# Patient Record
Sex: Female | Born: 1955 | Hispanic: No | Marital: Married | Smoking: Never smoker
Health system: Southern US, Community
[De-identification: ages and names within clinical notes are randomized; demographics above are authoritative.]

## PROBLEM LIST (undated history)

## (undated) DIAGNOSIS — I10 Essential (primary) hypertension: Secondary | ICD-10-CM

---

## 2002-09-05 ENCOUNTER — Other Ambulatory Visit: Admission: RE | Admit: 2002-09-05 | Discharge: 2002-09-05 | Payer: Self-pay | Admitting: Gynecology

## 2016-04-07 ENCOUNTER — Other Ambulatory Visit: Payer: Self-pay | Admitting: Obstetrics and Gynecology

## 2016-04-07 DIAGNOSIS — R928 Other abnormal and inconclusive findings on diagnostic imaging of breast: Secondary | ICD-10-CM

## 2016-04-13 ENCOUNTER — Ambulatory Visit
Admission: RE | Admit: 2016-04-13 | Discharge: 2016-04-13 | Disposition: A | Discharge status: Home / Self Care | Payer: BC Managed Care – PPO | Source: Ambulatory Visit | Attending: Obstetrics and Gynecology | Admitting: Obstetrics and Gynecology

## 2016-04-13 DIAGNOSIS — R928 Other abnormal and inconclusive findings on diagnostic imaging of breast: Secondary | ICD-10-CM

## 2017-04-04 ENCOUNTER — Emergency Department (HOSPITAL_BASED_OUTPATIENT_CLINIC_OR_DEPARTMENT_OTHER)
Admission: EM | Admit: 2017-04-04 | Discharge: 2017-04-04 | Disposition: A | Discharge status: Home / Self Care | Payer: Worker's Compensation | Attending: Emergency Medicine | Admitting: Emergency Medicine

## 2017-04-04 ENCOUNTER — Encounter (HOSPITAL_BASED_OUTPATIENT_CLINIC_OR_DEPARTMENT_OTHER): Payer: Self-pay | Admitting: Emergency Medicine

## 2017-04-04 ENCOUNTER — Emergency Department (HOSPITAL_BASED_OUTPATIENT_CLINIC_OR_DEPARTMENT_OTHER): Payer: Worker's Compensation

## 2017-04-04 DIAGNOSIS — Y9289 Other specified places as the place of occurrence of the external cause: Secondary | ICD-10-CM | POA: Insufficient documentation

## 2017-04-04 DIAGNOSIS — Y939 Activity, unspecified: Secondary | ICD-10-CM | POA: Diagnosis not present

## 2017-04-04 DIAGNOSIS — Y990 Civilian activity done for income or pay: Secondary | ICD-10-CM | POA: Diagnosis not present

## 2017-04-04 DIAGNOSIS — M25522 Pain in left elbow: Secondary | ICD-10-CM | POA: Diagnosis not present

## 2017-04-04 DIAGNOSIS — Z79899 Other long term (current) drug therapy: Secondary | ICD-10-CM | POA: Diagnosis not present

## 2017-04-04 DIAGNOSIS — I10 Essential (primary) hypertension: Secondary | ICD-10-CM | POA: Diagnosis not present

## 2017-04-04 DIAGNOSIS — W010XXA Fall on same level from slipping, tripping and stumbling without subsequent striking against object, initial encounter: Secondary | ICD-10-CM | POA: Diagnosis not present

## 2017-04-04 HISTORY — DX: Essential (primary) hypertension: I10

## 2017-04-04 NOTE — ED Triage Notes (Signed)
Pt c/o LUE pain x 3 wks s/p falling over crate onto floor on LT side

## 2017-04-04 NOTE — ED Provider Notes (Signed)
[facility]   CSN: [id number] Arrival date & time: 04/04/17  4098     History   Chief Complaint Chief Complaint  Patient presents with  . Arm Pain    HPI Julie Sexton is a 61 y.o. female.  HPI 61 year old female with past medical history significant for hypertension presents to the emergency Department today with complaints of left elbow pain. Patient states that approximately 3 weeks ago she was at work and fell over a crate landing onto her left elbow. She denies any head trauma or LOC. States that initially she had significant amount of pain and was doing over-the-counter remedies. States that her range of motion has slightly improved but she continues to have pain in her left elbow. This is worse when she is weight lifting. Worse with flexion of the elbow. Denies any erythema, edema, ecchymosis, paresthesias, weakness. Past Medical History:  Diagnosis Date  . Hypertension     There are no active problems to display for this patient.   History reviewed. No pertinent surgical history.  OB History    No data available       Home Medications    Prior to Admission medications   Medication Sig Start Date End Date Taking? Authorizing Provider  amLODipine (NORVASC) 5 MG tablet Take 5 mg by mouth daily.   Yes [provider]  cholecalciferol (VITAMIN D) 1000 units tablet Take 2,000 Units by mouth daily.   Yes [provider]    Family History No family history on file.  Social History Social History  Substance Use Topics  . Smoking status: Never Smoker  . Smokeless tobacco: Never Used  . Alcohol use Yes     Comment: occ     Allergies   Patient has no known allergies.   Review of Systems Review of Systems  Constitutional: Negative for chills and fever.  Eyes: Negative for visual disturbance.  Musculoskeletal: Positive for arthralgias and joint swelling.  Skin: Negative for wound.  Neurological: Negative for  syncope, weakness, numbness and headaches.     Physical Exam Updated Vital Signs BP (!) 166/72   Pulse 74   Temp 98.7 F (37.1 C) (Oral)   Resp 16   Ht 5\' 2"  (1.575 m)   Wt 69.4 kg (153 lb)   SpO2 100%   BMI 27.98 kg/m   Physical Exam  Constitutional: She appears well-developed and well-nourished. No distress.  HENT:  Head: Normocephalic and atraumatic.  Eyes: Right eye exhibits no discharge. Left eye exhibits no discharge. No scleral icterus.  Neck: Normal range of motion.  Pulmonary/Chest: No respiratory distress.  Musculoskeletal:       Left elbow: She exhibits decreased range of motion (worse with flexion). She exhibits no swelling, no effusion, no deformity and no laceration. Tenderness found. Radial head, medial epicondyle and lateral epicondyle tenderness noted. No olecranon process tenderness noted.  Radial pulses are 2+ bilaterally. Sensation intact. Cap refill normal.  Neurological: She is alert.  Strength 5 out of 5 with grip strength bilaterally in upper extremities.   Skin: No pallor.  Psychiatric: Her behavior is normal. Judgment and thought content normal.  Nursing note and vitals reviewed.    ED Treatments / Results  Labs (all labs ordered are listed, but only abnormal results are displayed) Labs Reviewed - No data to display  EKG  EKG Interpretation None       Radiology Dg Elbow Complete Left  Result Date: 04/04/2017 CLINICAL DATA:  Medial elbow pain  secondary to a fall 3 weeks ago. EXAM: LEFT ELBOW - COMPLETE 3+ VIEW COMPARISON:  None. FINDINGS: There is calcification in the soft tissues adjacent and the medial and lateral epicondyles of the distal humerus consistent with calcific tendinopathy. There is suggestion of an elbow joint effusion on the lateral view but there is no discrete fracture or dislocation. IMPRESSION: 1. Nonspecific joint effusion. 2. 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 Electronically  Signed   By: Francene BoyersJames  Maxwell M.D.   On: 04/04/2017 11:01    Procedures Procedures (including critical care time)  Medications Ordered in ED Medications - No data to display   Initial Impression / Assessment and Plan / ED Course  I have reviewed the triage vital signs and the nursing notes.  Pertinent labs & imaging results that were available during my care of the patient were reviewed by me and considered in my medical decision making (see chart for details).     Patient presents to the ED with complaints of left elbow pain after mechanical fall 3 weeks ago. She is neurovascularly intact. Strength is normal. Range of motion is normal. X-ray shows nonspecific joint effusion and chronic tendinopathy consistent with epicondylitis. No acute fractures noted. We'll place in sling and encouraged symptomatically treatment at home with orthopedic follow-up.  Pt is hemodynamically stable, in NAD, & able to ambulate in the ED. Evaluation does not show pathology that would require ongoing emergent intervention or inpatient treatment. I explained the diagnosis to the patient. Pain has been managed & has no complaints prior to dc. Pt is comfortable with above plan and is stable for discharge at this time. All questions were answered prior to disposition. Strict return precautions for f/u to the ED were discussed. Encouraged follow up with PCP.   Final Clinical Impressions(s) / ED Diagnoses   Final diagnoses:  Left elbow pain    New Prescriptions Discharge Medication List as of 04/04/2017 11:38 AM       Rise MuLeaphart, Shanta Dorvil T, PA-C 04/04/17 1243    Charlynne PanderYao, David Hsienta, MD 04/04/17 [phone]

## 2017-04-04 NOTE — Discharge Instructions (Signed)
Your x-ray shows no fracture. Small elbow fluid. Wear the sling until follow-up with orthopedist. Please rest, ice, compress and elevated the affected body part to help with swelling and pain. Motrin Tylenol for pain and swelling. Follow-up with orthopedist. Return to the ED if symptoms worsen.

## 2017-04-04 NOTE — ED Notes (Signed)
Patient transported to X-ray 

## 2017-04-13 ENCOUNTER — Ambulatory Visit: Payer: Self-pay | Admitting: Family Medicine

## 2017-08-24 IMAGING — MG 2D DIGITAL DIAGNOSTIC UNILATERAL RIGHT MAMMOGRAM WITH CAD AND AD
6 series · 6 of 14 positions shown · non-contrast
Comparison: Previous exam(s).

CLINICAL DATA: Screening recall for possible right breast
asymmetry.

EXAM:
2D DIGITAL DIAGNOSTIC UNILATERAL RIGHT MAMMOGRAM WITH CAD AND
ADJUNCT TOMO

[R MLO synth-2D]
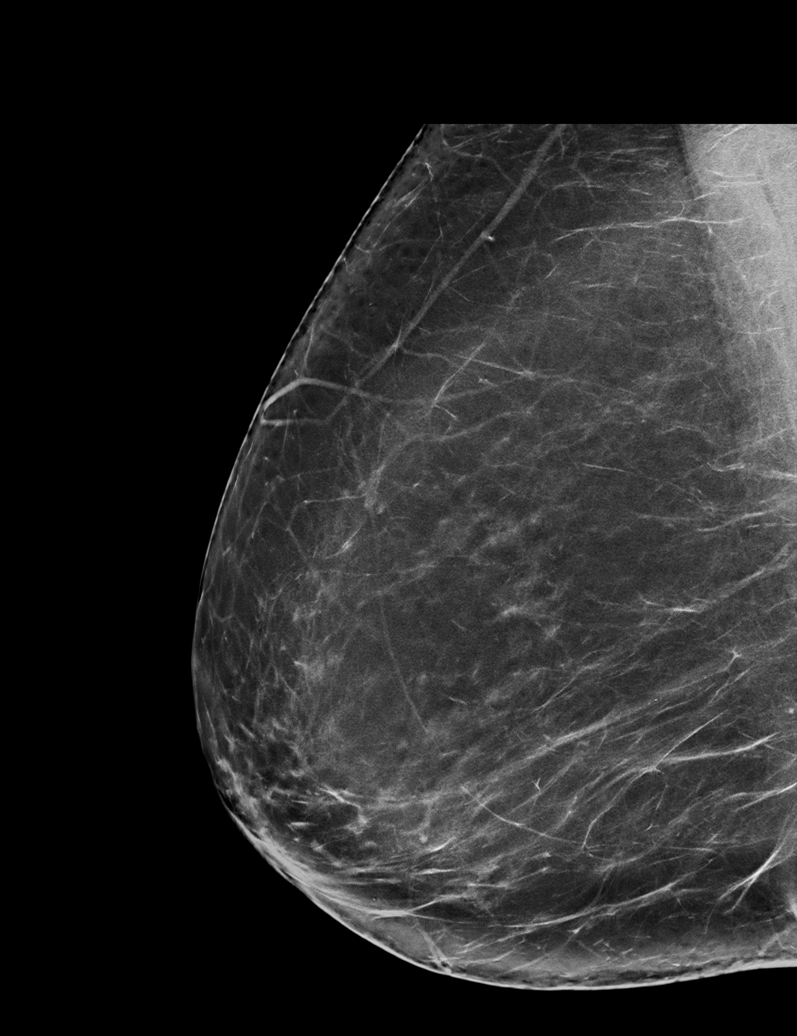

[R MLO]
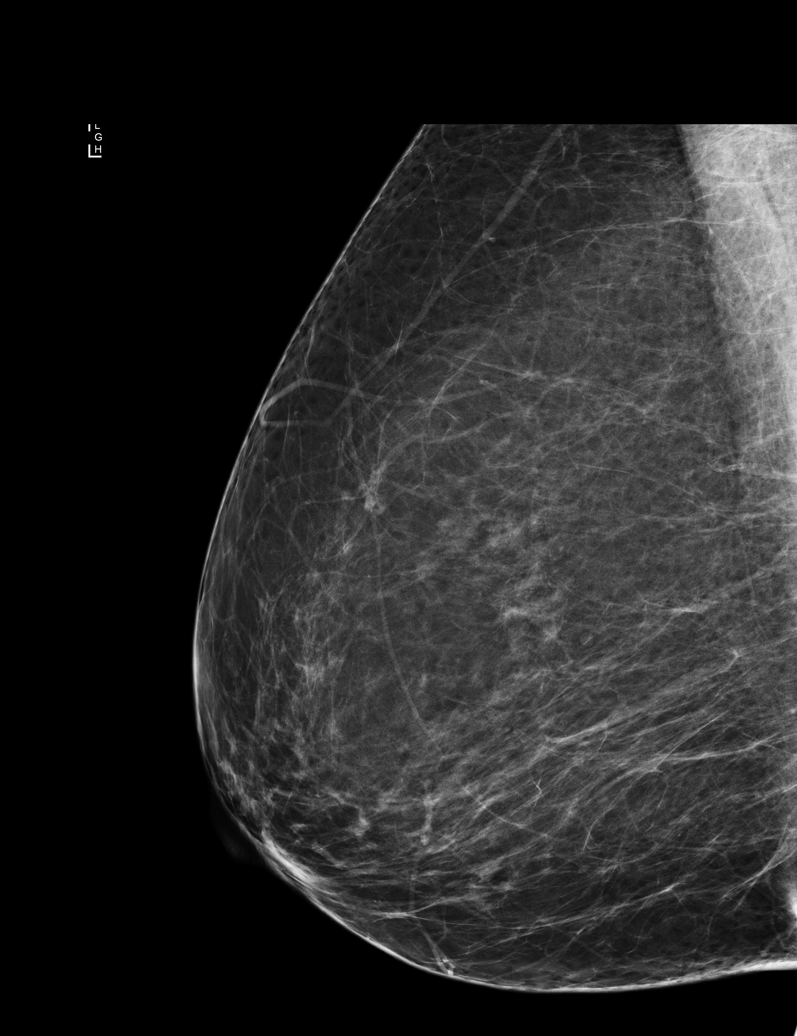

[R CC synth-2D]
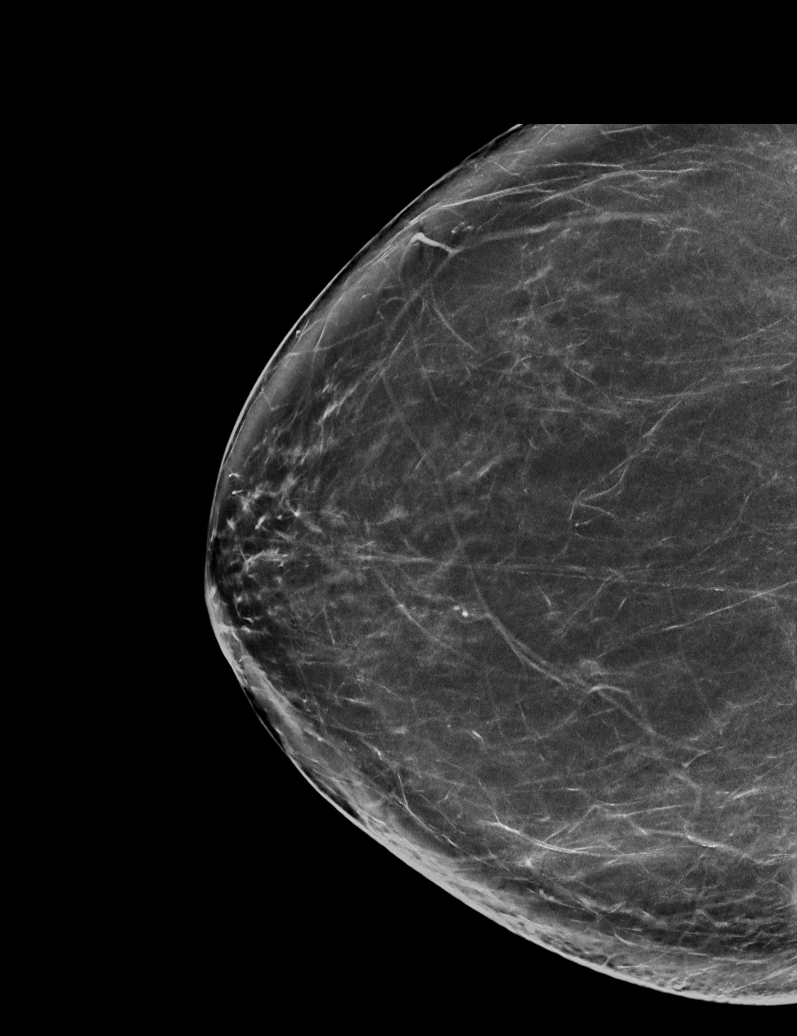

[R CC]
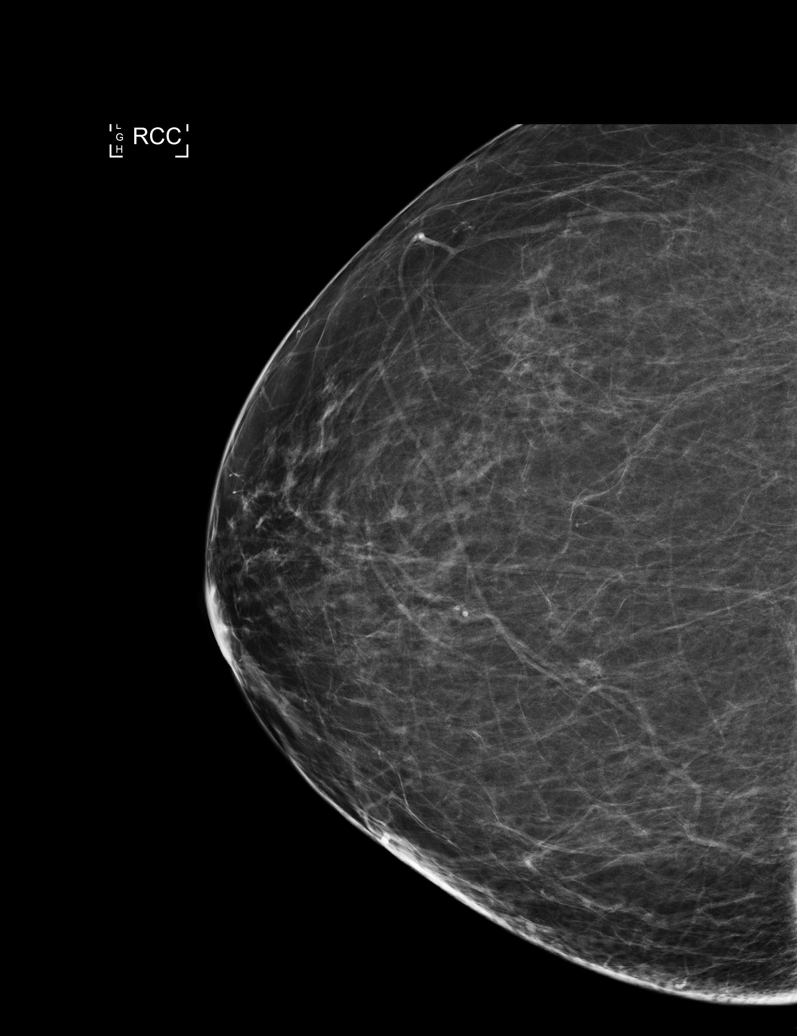

[R CC tomo · tomo slice 40/79.0]
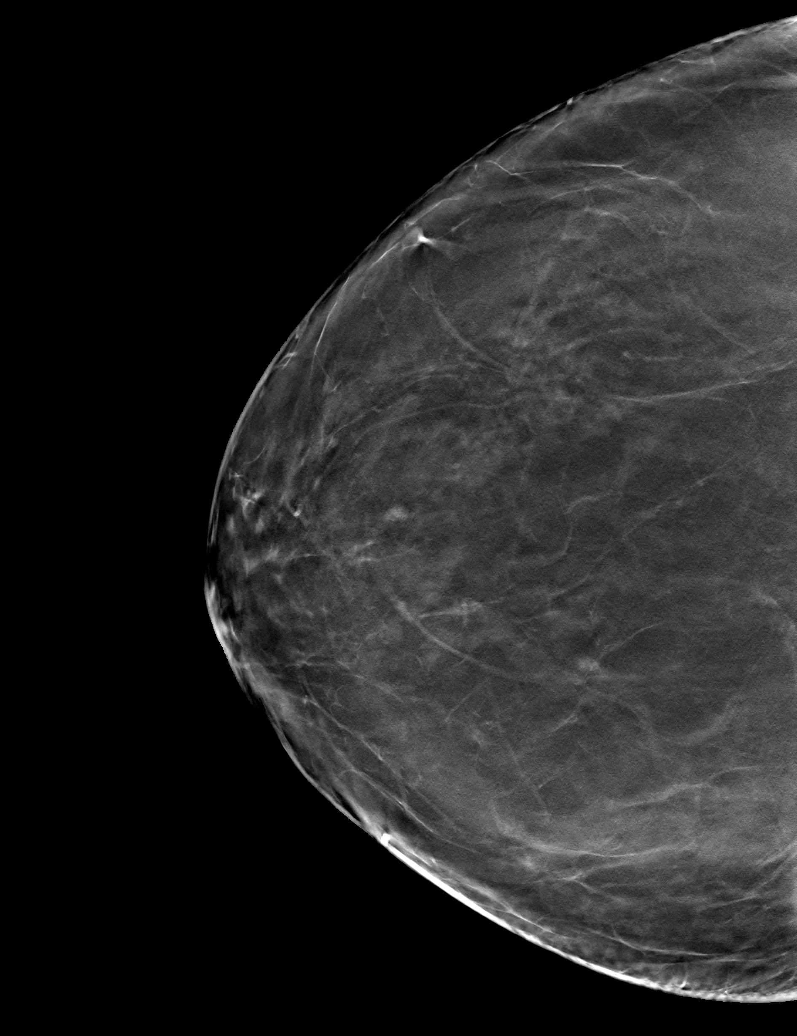

[R MLO tomo · tomo slice 42/83.0]
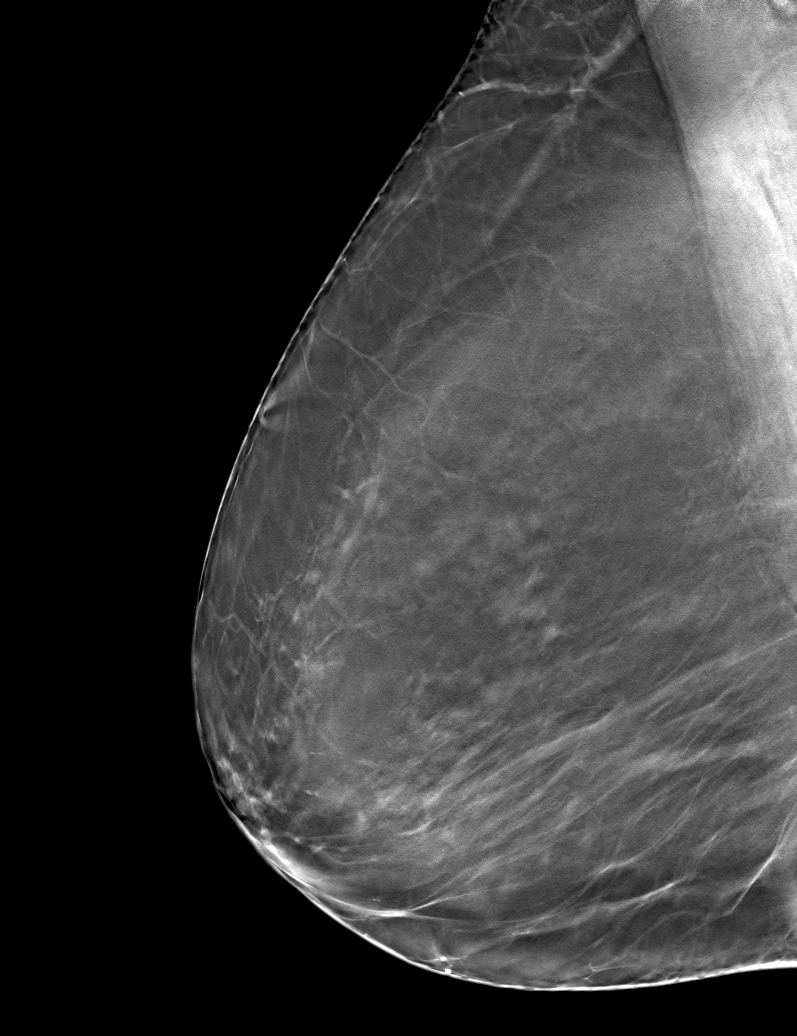

[6 of 14 positions shown; findings below may reference images not displayed]

ACR Breast Density Category b: There are scattered areas of
fibroglandular density.
FINDINGS: Cc and MLO tomograms were performed of the right breast. There is no
persistent mass or distortion seen in the right breast. There is no
mammographic evidence of malignancy in the right breast.

Mammographic images were processed with CAD.
IMPRESSION: No mammographic evidence of malignancy in the right breast.

RECOMMENDATION:
Screening mammogram in one year.(Code:[id number])

I have discussed the findings and recommendations with the patient.
Results were also provided in writing at the conclusion of the
visit. If applicable, a reminder letter will be sent to the patient
regarding the next appointment.

BI-RADS CATEGORY  1: Negative.

## 2018-05-25 ENCOUNTER — Ambulatory Visit: Payer: Self-pay | Admitting: Family Medicine

## 2018-06-02 NOTE — Progress Notes (Signed)
[name] [address] [place], [place] [address] Phone: [phone] Subjective:   Julie Sexton, am serving as a scribe for Dr. Antoine Primas.  I'm seeing this patient by the request  of:  [name], MD   CC: right shoulder pain   LFY:[id number]  Julie Sexton is a 62 y.o. female coming in with complaint of right shoulder pain. Pain for 1 month in posterior deltoid. Insidious onset. Patient is limited in IR and is unable to take bra off. Painful when lying on shoulder. Denies any radicular symptoms. Patient notices a click in the shoulder. Has not done anything to treat pain.  States that it is annoying and can be uncomfortable at night but not stopping her from activities.      Past Medical History:  Diagnosis Date  . Hypertension    No past surgical history on file. Social History   Socioeconomic History  . Marital status: Married    Spouse name: Not on file  . Number of children: Not on file  . Years of education: Not on file  . Highest education level: Not on file  Occupational History  . Not on file  Social Needs  . Financial resource strain: Not on file  . Food insecurity:    Worry: Not on file    Inability: Not on file  . Transportation needs:    Medical: Not on file    Non-medical: Not on file  Tobacco Use  . Smoking status: Never Smoker  . Smokeless tobacco: Never Used  Substance and Sexual Activity  . Alcohol use: Yes    Comment: occ  . Drug use: No  . Sexual activity: Not on file  Lifestyle  . Physical activity:    Days per week: Not on file    Minutes per session: Not on file  . Stress: Not on file  Relationships  . Social connections:    Talks on phone: Not on file    Gets together: Not on file    Attends religious service: Not on file    Active member of club or organization: Not on file    Attends meetings of clubs or organizations: Not on file    Relationship status: Not on file  Other Topics  Concern  . Not on file  Social History Narrative  . Not on file   No Known Allergies No family history on file.   Current Outpatient Medications (Cardiovascular):  .  amLODipine (NORVASC) 5 MG tablet, Take 5 mg by mouth daily.     Current Outpatient Medications (Other):  .  cholecalciferol (VITAMIN D) 1000 units tablet, Take 2,000 Units by mouth daily. .  Diclofenac Sodium (PENNSAID) 2 % SOLN, Place 2 g onto the skin 2 (two) times daily.    Past medical history, social, surgical and family history all reviewed in electronic medical record.  No pertanent information unless stated regarding to the chief complaint.   Review of Systems:  No headache, visual changes, nausea, vomiting, diarrhea, constipation, dizziness, abdominal pain, skin rash, fevers, chills, night sweats, weight loss, swollen lymph nodes, body aches, joint swelling, muscle aches, chest pain, shortness of breath, mood changes.   Objective  Blood pressure 120/74, pulse (!) 58, height 5\' 2"  (1.575 m), weight 156 lb (70.8 kg), SpO2 97 %.    General: No apparent distress alert and oriented x3 mood and affect normal, dressed appropriately.  HEENT: Pupils equal, extraocular movements intact  Respiratory: Patient's speak  in full sentences and does not appear short of breath  Cardiovascular: No lower extremity edema, non tender, no erythema  Skin: Warm dry intact with no signs of infection or rash on extremities or on axial skeleton.  Abdomen: Soft nontender  Neuro: Cranial nerves II through XII are intact, neurovascularly intact in all extremities with 2+ DTRs and 2+ pulses.  Lymph: No lymphadenopathy of posterior or anterior cervical chain or axillae bilaterally.  Gait normal with good balance and coordination.  MSK:  Non tender with full range of motion and good stability and symmetric strength and tone of elbows, wrist, hip, knee and ankles bilaterally.  Shoulder: Right Inspection reveals no abnormalities, atrophy  or asymmetry. Palpation is normal with no tenderness over AC joint or bicipital groove. ROM is full in all planes passively. Rotator cuff strength normal throughout. signs of impingement with positive Neer and Hawkin's tests, but negative empty can sign. Speeds and Yergason's tests normal. No labral pathology noted with negative Obrien's, negative clunk and good stability. Normal scapular function observed. No painful arc and no drop arm sign. No apprehension sign  MSK US performed of: Right This study was ordered, performed, and interpreted by Terrilee Files D.O.  Shoulder:   Supraspinatus:  Appears normal on long and transverse views, Bursal bulge seen with shoulder abduction on impingement view. Infraspinatus:  Appears normal on long and transverse views. Significant increase in Doppler flow Subscapularis:  Appears normal on long and transverse views. Positive bursa Teres Minor:  Appears normal on long and transverse views. AC joint:  Capsule undistended, no geyser sign. Glenohumeral Joint:  Appears normal without effusion. Glenoid Labrum:  Intact without visualized tears. Biceps Tendon:  Appears normal on long and transverse views, no fraying of tendon, tendon located in intertubercular groove, no subluxation with shoulder internal or external rotation.  Impression: Subacromial bursitis  97110; 15 additional minutes spent for Therapeutic exercises as stated in above notes.  This included exercises focusing on stretching, strengthening, with significant focus on eccentric aspects.   Long term goals include an improvement in range of motion, strength, endurance as well as avoiding reinjury. Patient's frequency would include in 1-2 times a day, 3-5 times a week for a duration of 6-12 weeks. Shoulder Exercises that included:  Basic scapular stabilization to include adduction and depression of scapula Scaption, focusing on proper movement and good control Internal and External rotation  utilizing a theraband, with elbow tucked at side entire time Rows with theraband which was given  Proper technique shown and discussed handout in great detail with ATC.  All questions were discussed and answered.     Impression and Recommendations:     This case required medical decision making of moderate complexity. The above documentation has been reviewed and is accurate and complete Judi Saa, DO       Note: This dictation was prepared with Dragon dictation along with smaller phrase technology. Any transcriptional errors that result from this process are unintentional.

## 2018-06-05 ENCOUNTER — Encounter: Payer: Self-pay | Admitting: Family Medicine

## 2018-06-05 ENCOUNTER — Ambulatory Visit: Payer: BC Managed Care – PPO | Admitting: Family Medicine

## 2018-06-05 ENCOUNTER — Ambulatory Visit: Payer: Self-pay

## 2018-06-05 VITALS — BP 120/74 | HR 58 | Ht 62.0 in | Wt 156.0 lb

## 2018-06-05 DIAGNOSIS — G8929 Other chronic pain: Secondary | ICD-10-CM | POA: Diagnosis not present

## 2018-06-05 DIAGNOSIS — M25511 Pain in right shoulder: Principal | ICD-10-CM

## 2018-06-05 DIAGNOSIS — M7551 Bursitis of right shoulder: Secondary | ICD-10-CM | POA: Diagnosis not present

## 2018-06-05 MED ORDER — DICLOFENAC SODIUM 2 % TD SOLN: 2.0000 g | Freq: Two times a day (BID) | TRANSDERMAL | 3 refills | Status: AC

## 2018-06-05 NOTE — Assessment & Plan Note (Signed)
Ultrasound shows no significant tear of the rotator cuff and patient does not have any significant weakness.  Topical anti-inflammatories given, home exercises given and work with Event organiser.  Discussed icing protocol and ergonomics throughout the day.  Follow-up again in 4 weeks.  Worsening symptoms consider injection and formal physical therapy.

## 2018-06-05 NOTE — Patient Instructions (Signed)
Good to see you.  Ice 20 minutes 2 times daily. Usually after activity and before bed. pennsaid pinkie amount topically 2 times daily as needed.  Keep hands within peripheral vison  Exercises 3 times a week.   Increase vitamin D to 4000 IU daily  Tart cherry extract any dose at night See me again in 4 weeks to make sure much better

## 2018-07-06 ENCOUNTER — Ambulatory Visit: Payer: Self-pay | Admitting: Family Medicine

## 2018-08-16 ENCOUNTER — Ambulatory Visit: Payer: Self-pay | Admitting: Family Medicine

## 2018-10-16 ENCOUNTER — Ambulatory Visit: Payer: Self-pay | Admitting: Family Medicine

## 2018-10-16 NOTE — Progress Notes (Deleted)
[facility]og[facility] D.[facility] [address] [place], [place] [address] Phone: [phone] Subjective:    CC: Right shoulder pain follow-up  BJY:[id number]:Subjective  Julie Sexton is a 63 y.o. female coming in with complaint of ***  Onset-  Location Duration-  Character- Aggravating factors- Reliving factors-  Therapies tried-  Severity-     Past Medical History:  Diagnosis Date  . Hypertension    No past surgical history on file. Social History   Socioeconomic History  . Marital status: Married    Spouse name: Not on file  . Number of children: Not on file  . Years of education: Not on file  . Highest education level: Not on file  Occupational History  . Not on file  Social Needs  . Financial resource strain: Not on file  . Food insecurity:    Worry: Not on file    Inability: Not on file  . Transportation needs:    Medical: Not on file    Non-medical: Not on file  Tobacco Use  . Smoking status: Never Smoker  . Smokeless tobacco: Never Used  Substance and Sexual Activity  . Alcohol use: Yes    Comment: occ  . Drug use: No  . Sexual activity: Not on file  Lifestyle  . Physical activity:    Days per week: Not on file    Minutes per session: Not on file  . Stress: Not on file  Relationships  . Social connections:    Talks on phone: Not on file    Gets together: Not on file    Attends religious service: Not on file    Active member of club or organization: Not on file    Attends meetings of clubs or organizations: Not on file    Relationship status: Not on file  Other Topics Concern  . Not on file  Social History Narrative  . Not on file   No Known Allergies No family history on file.   Current Outpatient Medications (Cardiovascular):  .  amLODipine (NORVASC) 5 MG tablet, Take 5 mg by mouth daily.     Current Outpatient Medications (Other):  .  cholecalciferol (VITAMIN D) 1000 units tablet, Take 2,000 Units by mouth daily. .  Diclofenac  Sodium (PENNSAID) 2 % SOLN, Place 2 g onto the skin 2 (two) times daily.    Past medical history, social, surgical and family history all reviewed in electronic medical record.  No pertanent information unless stated regarding to the chief complaint.   Review of Systems:  No headache, visual changes, nausea, vomiting, diarrhea, constipation, dizziness, abdominal pain, skin rash, fevers, chills, night sweats, weight loss, swollen lymph nodes, body aches, joint swelling, muscle aches, chest pain, shortness of breath, mood changes.   Objective  There were no vitals taken for this visit. Systems examined below as of    General: No apparent distress alert and oriented x3 mood and affect normal, dressed appropriately.  HEENT: Pupils equal, extraocular movements intact  Respiratory: Patient's speak in full sentences and does not appear short of breath  Cardiovascular: No lower extremity edema, non tender, no erythema  Skin: Warm dry intact with no signs of infection or rash on extremities or on axial skeleton.  Abdomen: Soft nontender  Neuro: Cranial nerves II through XII are intact, neurovascularly intact in all extremities with 2+ DTRs and 2+ pulses.  Lymph: No lymphadenopathy of posterior or anterior cervical chain or axillae bilaterally.  Gait normal with good balance and coordination.  MSK:  Non tender with full range of motion and good stability and symmetric strength and tone of  elbows, wrist, hip, knee and ankles bilaterally.  Shoulder: Right Inspection reveals no abnormalities, atrophy or asymmetry. Palpation is normal with no tenderness over AC joint or bicipital groove. ROM is full in all planes passively. Rotator cuff strength normal throughout. signs of impingement with positive Neer and Hawkin's tests, but negative empty can sign. Speeds and Yergason's tests normal. No labral pathology noted with negative Obrien's, negative clunk and good stability. Normal scapular function  observed. No painful arc and no drop arm sign. No apprehension sign  MSK US performed of: Right This study was ordered, performed, and interpreted by Terrilee Files D.O.  Shoulder:   Supraspinatus:  Appears normal on long and transverse views, Bursal bulge seen with shoulder abduction on impingement view. Infraspinatus:  Appears normal on long and transverse views. Significant increase in Doppler flow Subscapularis:  Appears normal on long and transverse views. Positive bursa Teres Minor:  Appears normal on long and transverse views. AC joint:  Capsule undistended, no geyser sign. Glenohumeral Joint:  Appears normal without effusion. Glenoid Labrum:  Intact without visualized tears. Biceps Tendon:  Appears normal on long and transverse views, no fraying of tendon, tendon located in intertubercular groove, no subluxation with shoulder internal or external rotation.  Impression: Subacromial bursitis  Procedure: Real-time Ultrasound Guided Injection of right glenohumeral joint Device: GE Logiq E  Ultrasound guided injection is preferred based studies that show increased duration, increased effect, greater accuracy, decreased procedural pain, increased response rate with ultrasound guided versus blind injection.  Verbal informed consent obtained.  Time-out conducted.  Noted no overlying erythema, induration, or other signs of local infection.  Skin prepped in a sterile fashion.  Local anesthesia: Topical Ethyl chloride.  With sterile technique and under real time ultrasound guidance:  Joint visualized.  23g 1  inch needle inserted posterior approach. Pictures taken for needle placement. Patient did have injection of 2 cc of 1% lidocaine, 2 cc of 0.5% Marcaine, and 1.0 cc of Kenalog 40 mg/dL. Completed without difficulty  Pain immediately resolved suggesting accurate placement of the medication.  Advised to call if fevers/chills, erythema, induration, drainage, or persistent bleeding.  Images  permanently stored and available for review in the ultrasound unit.  Impression: Technically successful ultrasound guided injection.    Impression and Recommendations:     This case required medical decision making of moderate complexity. The above documentation has been reviewed and is accurate and complete Judi Saa, DO       Note: This dictation was prepared with Dragon dictation along with smaller phrase technology. Any transcriptional errors that result from this process are unintentional.

## 2019-03-15 ENCOUNTER — Other Ambulatory Visit: Payer: Self-pay

## 2019-03-15 ENCOUNTER — Encounter: Payer: Self-pay | Admitting: Physical Therapy

## 2019-03-15 ENCOUNTER — Ambulatory Visit: Payer: BC Managed Care – PPO | Attending: Orthopedic Surgery | Admitting: Physical Therapy

## 2019-03-15 DIAGNOSIS — M25511 Pain in right shoulder: Secondary | ICD-10-CM | POA: Diagnosis not present

## 2019-03-15 DIAGNOSIS — G8929 Other chronic pain: Secondary | ICD-10-CM | POA: Insufficient documentation

## 2019-03-15 DIAGNOSIS — M25611 Stiffness of right shoulder, not elsewhere classified: Secondary | ICD-10-CM | POA: Insufficient documentation

## 2019-03-15 DIAGNOSIS — R29898 Other symptoms and signs involving the musculoskeletal system: Secondary | ICD-10-CM

## 2019-03-15 DIAGNOSIS — M6281 Muscle weakness (generalized): Secondary | ICD-10-CM | POA: Insufficient documentation

## 2019-03-15 NOTE — Therapy (Addendum)
[facility] [address]  [place], [place], [address] Phone: [phone]   Fax:  [phone]  Physical Therapy Evaluation  Patient Details  Name: Julie Sexton MRN: [id number] Date of Birth: November 12, 1955 Referring Provider (PT): Victorino December, MD   Encounter Date: 03/15/2019  PT End of Session - 03/15/19 1448    Visit Number  1    Number of Visits  9    Date for PT Re-Evaluation  05/10/19    Authorization Type  State BCBS    PT Start Time  2637    PT Stop Time  1442    PT Time Calculation (min)  46 min    Activity Tolerance  Patient tolerated treatment well;Patient limited by pain    Behavior During Therapy  [facility] for tasks assessed/performed       Past Medical History:  Diagnosis Date  . Hypertension     History reviewed. No pertinent surgical history.  There were no vitals filed for this visit.   Subjective Assessment - 03/15/19 1358    Subjective  Patient reports R shoulder pain started about 1 year ago- cannot recall any changes in activities that could have caused this. 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 Pain initially started in the posterior shoulder, now more so in the lateral shoulder. Denies N/T. Worse with reaching behind back, taking on/off her bra, quick movements, reaching overhead, lifting weight. Has had cortisone injection and it helped for 3-4 weeks but pain returned to the same level as before.    Pertinent History  HTN    Limitations  Reading;House hold activities    Diagnostic tests  06/05/18 R shoulder musculoskeletal [place]: Subacromial bursitis    Patient Stated Goals  get rid of the pain as soon as possible    Currently in Pain?  Yes    Pain Score  0-No pain    Pain Location  Shoulder    Pain Orientation  Right    Pain Descriptors / Indicators  Constant;Throbbing    Pain Type  Chronic pain         [facility] PT Assessment - 03/15/19 1411      Assessment   Medical  Diagnosis  Adhesive Capsulitis of R shoulder    Referring Provider (PT)  Victorino December, MD    Onset Date/Surgical Date  03/14/18    Hand Dominance  Right    Next MD Visit  not scheduled    Prior Therapy  yes      Precautions   Precautions  None      Balance Screen   Has the patient fallen in the past 6 months  No    Has the patient had a decrease in activity level because of a fear of falling?   No    Is the patient reluctant to leave their home because of a fear of falling?   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    Leisure  power walking with weights      Cognition   Overall Cognitive Status  Within Functional Limits  for tasks assessed      Observation/Other Assessments   Focus on Therapeutic Outcomes (FOTO)   Shoulder: 60 (40% limited, 35% predicted)      Sensation   Light Touch  Appears Intact      Coordination   Gross Motor Movements are Fluid and Coordinated  Yes      Posture/Postural Control   Posture/Postural Control  Postural limitations    Postural Limitations  Rounded Shoulders;Weight shift right      ROM / Strength   AROM / PROM / Strength  AROM;Strength      AROM   AROM Assessment Site  Shoulder    Right/Left Shoulder  Right;Left    Right Shoulder Flexion  104 Degrees   mild pain   Right Shoulder ABduction  84 Degrees   mild pain   Right Shoulder Internal Rotation  --   FIR L1 w/ moderate pain   Right Shoulder External Rotation  --   FER top of head w/ moderate pain   Left Shoulder Flexion  160 Degrees    Left Shoulder ABduction  180 Degrees    Left Shoulder Internal Rotation  --   FIR T4   Left Shoulder External Rotation  --   FER T3     Strength   Strength  Assessment Site  Shoulder    Right/Left Shoulder  Right;Left    Right Shoulder Flexion  4/5   moderate anterior shoulder pain   Right Shoulder ABduction  4/5   moderate anterior shoulder pain   Right Shoulder Internal Rotation  4/5   moderate anterior shoulder pain   Right Shoulder External Rotation  4/5   moderate anterior shoulder pain   Left Shoulder Flexion  4+/5    Left Shoulder ABduction  4+/5    Left Shoulder Internal Rotation  4+/5    Left Shoulder External Rotation  4+/5      Palpation   Palpation comment  TTP in R infraspinatus insertion, proximal biceps tendon, biceps musclebelly; increased tone in R UT and biceps muscle belly                 Objective measurements completed on examination: See above findings.              PT Education - 03/15/19 1447    Education Details  prognosis, POC, HEP    Person(s) Educated  Patient    Methods  Explanation;Demonstration;Verbal cues;Tactile cues;Handout    Comprehension  Verbalized understanding;Returned demonstration       PT Short Term Goals - 03/15/19 1453      PT SHORT TERM GOAL #1   Title  Patient to be independent with initial HEP.    Time  4    Period  Weeks    Status  New    Target Date  04/12/19        PT Long Term Goals - 03/15/19 1453      PT LONG TERM GOAL #1   Title  Patient to be independent with advanced HEP.    Time  8    Period  Weeks    Status  New    Target Date  05/10/19      PT LONG TERM GOAL #2   Title  Patient to demonstrate >=4+/5 R shoulder strength.    Time  8    Period  Weeks    Status  New    Target Date  05/10/19      PT LONG TERM  GOAL #3   Title  Patient to demonstrate R shoulder AROM WFL and without pain limiting.    Time  8    Period  Weeks    Status  New    Target Date  05/10/19      PT LONG TERM GOAL #4   Title  Patient to report tolerance for donning/doffing bra without difficulty or pain.    Time  8    Period  Weeks    Status  New    Target  Date  05/10/19      PT LONG TERM GOAL #5   Title  Patient to demonstrate R shoulder overhead lifting with 5lbs, good shoulder mechanics, and no pain.    Time  5    Period  Weeks    Status  New    Target Date  05/10/19             Plan - 03/15/19 1448    Clinical Impression Statement  Patient is a 63y/o F presenting to O[facility]T with c/o chronic R shoulder pain of 1 year duration. Pain initially started in the posterior shoulder, now more so in the lateral shoulder. Worse with reaching behind back, taking on/off her bra, quick movements, reaching overhead, lifting weight. Patient today with painful and limited R shoulder AROM- most limited in IR and ER, limited R shoulder strength, TTP in R infraspinatus insertion, proximal biceps tendon, biceps muscle belly, increased tone in R UT and biceps muscle belly, and rounded shoulder posture. Patient educated on gentle AAROM HEP- patient reported understanding. Would benefit from skilled PT services 1x/week for 8 weeks to address aforementioned impairments.    Personal Factors and Comorbidities  Age;Comorbidity 1;Time since onset of injury/illness/exacerbation;Past/Current Experience    Comorbidities  HTN    Examination-Activity Limitations  Sleep;Bed Mobility;Carry;Dressing;Hygiene/Grooming;Lift;Reach Overhead    Examination-Participation Restrictions  School;Cleaning;Shop;Community Activity;Driving;Yard Work;Interpersonal Relationship;Laundry;Meal Prep    Stability/Clinical Decision Making  Stable/Uncomplicated    Clinical Decision Making  Low    Rehab Potential  Good    PT Frequency  1x / week    PT Duration  8 weeks    PT Treatment/Interventions  ADLs/Self Care Home Management;Cryotherapy;Electrical Stimulation;Iontophoresis 36m/ml Dexamethasone;Moist Heat;Therapeutic exercise;Therapeutic activities;Functional mobility training;Ultrasound;Neuromuscular re-education;Patient/family education;Manual techniques;Vasopneumatic  Device;Taping;Splinting;Energy conservation;Dry needling;Passive range of motion    PT Next Visit Plan  reassess HEP    Consulted and Agree with Plan of Care  Patient       Patient will benefit from skilled therapeutic intervention in order to improve the following deficits and impairments:  Hypomobility, Increased edema, Decreased activity tolerance, Decreased strength, Impaired UE functional use, Pain, Increased fascial restricitons, Decreased range of motion, Improper body mechanics, Postural dysfunction, Impaired flexibility  Visit Diagnosis: 1. Chronic right shoulder pain   2. Stiffness of right shoulder, not elsewhere classified   3. Muscle weakness (generalized)   4. Other symptoms and signs involving the musculoskeletal system        Problem List Patient Active Problem List   Diagnosis Date Noted  . Acute shoulder bursitis, right 06/05/2018     YJanene Harvey PT, DPT 03/15/19 2:59 PM   [facility] [address] [place] [address]Phone: [phone]  Fax:  [phone] Name: ASonia BromellMRN: [id number] of Birth: 806/18/1957  PHYSICAL THERAPY DISCHARGE SUMMARY  Visits from Start of Care: 1  Current functional level related to goals / functional outcomes: Unable to assess; patient did  not return d/t finances   Remaining deficits: Unable to assess   Education / Equipment: HEP  Plan: Patient agrees to discharge.  Patient goals were not met. Patient is being discharged due to financial reasons.  ?????     Janene Harvey, PT, DPT 04/16/19 3:09 PM

## 2019-03-22 ENCOUNTER — Ambulatory Visit: Payer: BC Managed Care – PPO

## 2019-11-24 ENCOUNTER — Ambulatory Visit: Payer: BC Managed Care – PPO | Attending: Internal Medicine

## 2019-11-24 DIAGNOSIS — Z23 Encounter for immunization: Secondary | ICD-10-CM

## 2019-11-24 NOTE — Progress Notes (Signed)
   Covid-19 Vaccination Clinic  Name:  Frimy Uffelman    MRN: [id number] DOB: Aug 13, 1956  11/24/2019  Ms. Roszak was observed post Covid-19 immunization for 15 minutes without incidence. She was provided with Vaccine Information Sheet and instruction to access the V-Safe system.   Ms. Smartt was instructed to call 911 with any severe reactions post vaccine: Marland Kitchen Difficulty breathing  . Swelling of your face and throat  . A fast heartbeat  . A bad rash all over your body  . Dizziness and weakness    Immunizations Administered    Name Date Dose VIS Date Route   Pfizer COVID-19 Vaccine 11/24/2019  5:02 PM 0.3 mL 09/07/2019 Intramuscular   Manufacturer: [employer], [employer]   Lot: [id number]   NDC: [address]-7

## 2019-12-15 ENCOUNTER — Ambulatory Visit: Payer: BC Managed Care – PPO | Attending: Internal Medicine

## 2019-12-15 DIAGNOSIS — Z23 Encounter for immunization: Secondary | ICD-10-CM

## 2019-12-15 NOTE — Progress Notes (Signed)
   Covid-19 Vaccination Clinic  Name:  Kindsey Eblin    MRN: [id number] DOB: Dec 11, 1955  12/15/2019  Ms. Bastien was observed post Covid-19 immunization for 15 minutes without incident. She was provided with Vaccine Information Sheet and instruction to access the V-Safe system.   Ms. Cabanilla was instructed to call 911 with any severe reactions post vaccine: Marland Kitchen Difficulty breathing  . Swelling of face and throat  . A fast heartbeat  . A bad rash all over body  . Dizziness and weakness   Immunizations Administered    Name Date Dose VIS Date Route   Pfizer COVID-19 Vaccine 12/15/2019  9:16 AM 0.3 mL 09/07/2019 Intramuscular   Manufacturer: [employer], [employer]   Lot: [id number]   NDC: [address]-7

## 2021-09-11 NOTE — Progress Notes (Signed)
[name] [address] [place] [address] Phone: [phone] Subjective:   [name], am serving as a scribe for Dr. Antoine Primas. This visit occurred during the SARS-CoV-2 public health emergency.  Safety protocols were in place, including screening questions prior to the visit, additional usage of staff PPE, and extensive cleaning of exam room while observing appropriate contact time as indicated for disinfecting solutions.  I'm seeing this patient by the request  of:  [name], MD  CC: Left shoulder pain  [id number]  Seen in 2019 for right shoulder  Julie Sexton is a 65 y.o. female coming in with complaint of anterior/superior left shoulder pain. Patient states her pain began 7 months ago. Pain with IR, flexion and abduction. Pain can be sharp. Having a hard time sleeping at night. Was seen in 2019 for R shoulder pain.      Past Medical History:  Diagnosis Date   Hypertension    No past surgical history on file. Social History   Socioeconomic History   Marital status: Married    Spouse name: Not on file   Number of children: Not on file   Years of education: Not on file   Highest education level: Not on file  Occupational History   Not on file  Tobacco Use   Smoking status: Never   Smokeless tobacco: Never  Vaping Use   Vaping Use: Never used  Substance and Sexual Activity   Alcohol use: Yes    Comment: occ   Drug use: No   Sexual activity: Not on file  Other Topics Concern   Not on file  Social History Narrative   Not on file   Social Determinants of Health   Financial Resource Strain: Not on file  Food Insecurity: Not on file  Transportation Needs: Not on file  Physical Activity: Not on file  Stress: Not on file  Social Connections: Not on file   No Known Allergies No family history on file.   Current Outpatient Medications (Cardiovascular):    amLODipine (NORVASC) 5 MG tablet, Take 5 mg by  mouth daily.     Current Outpatient Medications (Other):    cholecalciferol (VITAMIN D) 1000 units tablet, Take 2,000 Units by mouth daily.   Diclofenac Sodium (PENNSAID) 2 % SOLN, Place 2 g onto the skin 2 (two) times daily.   Reviewed prior external information including notes and imaging from  primary care provider As well as notes that were available from care everywhere and other healthcare systems.  Past medical history, social, surgical and family history all reviewed in electronic medical record.  No pertanent information unless stated regarding to the chief complaint.   Review of Systems:  No headache, visual changes, nausea, vomiting, diarrhea, constipation, dizziness, abdominal pain, skin rash, fevers, chills, night sweats, weight loss, swollen lymph nodes, body aches, joint swelling, chest pain, shortness of breath, mood changes. POSITIVE muscle aches  Objective  Blood pressure 138/74, pulse 94, height 5\' 2"  (1.575 m), weight 161 lb (73 kg), SpO2 98 %.   General: No apparent distress alert and oriented x3 mood and affect normal, dressed appropriately.  HEENT: Pupils equal, extraocular movements intact  Respiratory: Patient's speak in full sentences and does not appear short of breath  Cardiovascular: No lower extremity edema, non tender, no erythema  Gait normal with good balance and coordination.  MSK: Left shoulder exam shows the patient does have some limited range of motion noted.  Patient does  have a decrease of 5 degrees of internal and external range of motion.  Rotator cuff strength appears to be intact.  Limited muscular skeletal ultrasound was performed and interpreted by Antoine Primas, M Limited ultrasound shows the patient does have mild hypoechoic changes of the supraspinatus area.  Patient noted does have thickening at the capsule on the anterior and superior levels. Impression: Mild tendinitis as well as likely adhesive capsulitis  Procedure: Real-time  Ultrasound Guided Injection of left glenohumeral joint Device: GE Logiq E  Ultrasound guided injection is preferred based studies that show increased duration, increased effect, greater accuracy, decreased procedural pain, increased response rate with ultrasound guided versus blind injection.  Verbal informed consent obtained.  Time-out conducted.  Noted no overlying erythema, induration, or other signs of local infection.  Skin prepped in a sterile fashion.  Local anesthesia: Topical Ethyl chloride.  With sterile technique and under real time ultrasound guidance:  Joint visualized.  21g 2 inch needle inserted posterior approach. Pictures taken for needle placement. Patient did have injection of 2 cc of 0.5% Marcaine, and 1cc of Kenalog 40 mg/dL. Completed without difficulty  Advised to call if fevers/chills, erythema, induration, drainage, or persistent bleeding.  Impression: Technically successful ultrasound guided injection.  [id number]; 15 additional minutes spent for Therapeutic exercises as stated in above notes.  This included exercises focusing on stretching, strengthening, with significant focus on eccentric aspects.   Long term goals include an improvement in range of motion, strength, endurance as well as avoiding reinjury. Patient's frequency would include in 1-2 times a day, 3-5 times a week for a duration of 6-12 weeks. Shoulder Exercises that included:  Basic scapular stabilization to include adduction and depression of scapula Scaption, focusing on proper movement and good control Internal and External rotation utilizing a theraband, with elbow tucked at side entire time Rows with theraband   Proper technique shown and discussed handout in great detail with ATC.  All questions were discussed and answered.      Impression and Recommendations:     The above documentation has been reviewed and is accurate and complete Judi Saa, DO

## 2021-09-14 ENCOUNTER — Other Ambulatory Visit: Payer: Self-pay

## 2021-09-14 ENCOUNTER — Encounter: Payer: Self-pay | Admitting: Family Medicine

## 2021-09-14 ENCOUNTER — Ambulatory Visit (INDEPENDENT_AMBULATORY_CARE_PROVIDER_SITE_OTHER): Payer: BC Managed Care – PPO

## 2021-09-14 ENCOUNTER — Ambulatory Visit (INDEPENDENT_AMBULATORY_CARE_PROVIDER_SITE_OTHER): Payer: BC Managed Care – PPO | Admitting: Family Medicine

## 2021-09-14 ENCOUNTER — Ambulatory Visit: Payer: Self-pay

## 2021-09-14 VITALS — BP 138/74 | HR 94 | Ht 62.0 in | Wt 161.0 lb

## 2021-09-14 DIAGNOSIS — M25512 Pain in left shoulder: Secondary | ICD-10-CM

## 2021-09-14 DIAGNOSIS — M7502 Adhesive capsulitis of left shoulder: Secondary | ICD-10-CM

## 2021-09-14 NOTE — Patient Instructions (Addendum)
Exercises L shoulder Xray today Injection today Vit D 2000IU daily See me again in 6-8 weeks

## 2021-09-14 NOTE — Assessment & Plan Note (Signed)
Patient did have injection today.  Tolerated the procedure well.  Patient does recently have the diagnosis of diabetes.  Could be potentially playing a role.  Discussed keeping blood sugars under control.  Discussed vitamin D supplementations.  Increase range of motion and patient work with Event organiser.  Follow-up again in 6 to 8 weeks

## 2021-11-06 ENCOUNTER — Ambulatory Visit: Payer: BC Managed Care – PPO | Admitting: Family Medicine

## 2022-06-21 ENCOUNTER — Ambulatory Visit: Payer: BC Managed Care – PPO | Admitting: Family Medicine

## 2023-05-19 DIAGNOSIS — H52223 Regular astigmatism, bilateral: Secondary | ICD-10-CM | POA: Diagnosis not present

## 2023-05-24 DIAGNOSIS — Z1231 Encounter for screening mammogram for malignant neoplasm of breast: Secondary | ICD-10-CM | POA: Diagnosis not present

## 2023-05-24 DIAGNOSIS — Z01419 Encounter for gynecological examination (general) (routine) without abnormal findings: Secondary | ICD-10-CM | POA: Diagnosis not present

## 2023-05-24 DIAGNOSIS — Z124 Encounter for screening for malignant neoplasm of cervix: Secondary | ICD-10-CM | POA: Diagnosis not present

## 2023-05-25 ENCOUNTER — Other Ambulatory Visit: Payer: Self-pay | Admitting: Obstetrics and Gynecology

## 2023-05-25 DIAGNOSIS — E2839 Other primary ovarian failure: Secondary | ICD-10-CM

## 2023-05-26 ENCOUNTER — Other Ambulatory Visit: Payer: Self-pay | Admitting: Obstetrics and Gynecology

## 2023-05-26 DIAGNOSIS — R928 Other abnormal and inconclusive findings on diagnostic imaging of breast: Secondary | ICD-10-CM

## 2023-06-03 ENCOUNTER — Other Ambulatory Visit: Payer: BC Managed Care – PPO

## 2023-06-14 DIAGNOSIS — N644 Mastodynia: Secondary | ICD-10-CM | POA: Diagnosis not present

## 2023-06-15 ENCOUNTER — Ambulatory Visit
Admission: RE | Admit: 2023-06-15 | Discharge: 2023-06-15 | Disposition: A | Discharge status: Home / Self Care | Payer: Medicare HMO | Source: Ambulatory Visit | Attending: Obstetrics and Gynecology | Admitting: Obstetrics and Gynecology

## 2023-06-15 ENCOUNTER — Encounter: Payer: Self-pay | Admitting: Obstetrics and Gynecology

## 2023-06-15 ENCOUNTER — Other Ambulatory Visit: Payer: Self-pay | Admitting: Obstetrics and Gynecology

## 2023-06-15 ENCOUNTER — Ambulatory Visit
Admission: RE | Admit: 2023-06-15 | Discharge: 2023-06-15 | Disposition: A | Discharge status: Home / Self Care | Payer: BC Managed Care – PPO | Source: Ambulatory Visit | Attending: Obstetrics and Gynecology | Admitting: Obstetrics and Gynecology

## 2023-06-15 DIAGNOSIS — N644 Mastodynia: Secondary | ICD-10-CM | POA: Diagnosis not present

## 2023-06-15 DIAGNOSIS — R928 Other abnormal and inconclusive findings on diagnostic imaging of breast: Secondary | ICD-10-CM

## 2023-06-22 DIAGNOSIS — H52223 Regular astigmatism, bilateral: Secondary | ICD-10-CM | POA: Diagnosis not present

## 2023-06-22 DIAGNOSIS — H524 Presbyopia: Secondary | ICD-10-CM | POA: Diagnosis not present

## 2023-08-08 DIAGNOSIS — Z008 Encounter for other general examination: Secondary | ICD-10-CM | POA: Diagnosis not present

## 2023-09-10 DIAGNOSIS — R69 Illness, unspecified: Secondary | ICD-10-CM | POA: Diagnosis not present

## 2023-10-14 DIAGNOSIS — Z23 Encounter for immunization: Secondary | ICD-10-CM | POA: Diagnosis not present

## 2023-10-14 DIAGNOSIS — Z111 Encounter for screening for respiratory tuberculosis: Secondary | ICD-10-CM | POA: Diagnosis not present

## 2023-10-14 DIAGNOSIS — E119 Type 2 diabetes mellitus without complications: Secondary | ICD-10-CM | POA: Diagnosis not present

## 2023-10-14 DIAGNOSIS — Z6828 Body mass index (BMI) 28.0-28.9, adult: Secondary | ICD-10-CM | POA: Diagnosis not present

## 2023-10-18 DIAGNOSIS — R7611 Nonspecific reaction to tuberculin skin test without active tuberculosis: Secondary | ICD-10-CM | POA: Diagnosis not present

## 2023-11-29 ENCOUNTER — Ambulatory Visit
Admission: RE | Admit: 2023-11-29 | Discharge: 2023-11-29 | Disposition: A | Discharge status: Home / Self Care | Payer: BC Managed Care – PPO | Source: Ambulatory Visit | Attending: Obstetrics and Gynecology | Admitting: Obstetrics and Gynecology

## 2023-11-29 DIAGNOSIS — E2839 Other primary ovarian failure: Secondary | ICD-10-CM

## 2023-11-29 DIAGNOSIS — N958 Other specified menopausal and perimenopausal disorders: Secondary | ICD-10-CM | POA: Diagnosis not present

## 2024-03-14 DIAGNOSIS — R0683 Snoring: Secondary | ICD-10-CM | POA: Diagnosis not present

## 2024-05-29 DIAGNOSIS — Z1231 Encounter for screening mammogram for malignant neoplasm of breast: Secondary | ICD-10-CM | POA: Diagnosis not present

## 2024-07-05 DIAGNOSIS — M25562 Pain in left knee: Secondary | ICD-10-CM | POA: Diagnosis not present

## 2024-07-05 DIAGNOSIS — R002 Palpitations: Secondary | ICD-10-CM | POA: Diagnosis not present

## 2024-07-05 DIAGNOSIS — R42 Dizziness and giddiness: Secondary | ICD-10-CM | POA: Diagnosis not present

## 2024-07-05 DIAGNOSIS — E785 Hyperlipidemia, unspecified: Secondary | ICD-10-CM | POA: Diagnosis not present

## 2024-07-05 DIAGNOSIS — E119 Type 2 diabetes mellitus without complications: Secondary | ICD-10-CM | POA: Diagnosis not present

## 2024-07-05 DIAGNOSIS — I10 Essential (primary) hypertension: Secondary | ICD-10-CM | POA: Diagnosis not present

## 2024-07-05 DIAGNOSIS — Z6828 Body mass index (BMI) 28.0-28.9, adult: Secondary | ICD-10-CM | POA: Diagnosis not present

## 2024-07-10 DIAGNOSIS — H52223 Regular astigmatism, bilateral: Secondary | ICD-10-CM | POA: Diagnosis not present

## 2024-07-10 DIAGNOSIS — E119 Type 2 diabetes mellitus without complications: Secondary | ICD-10-CM | POA: Diagnosis not present

## 2024-07-16 DIAGNOSIS — M25562 Pain in left knee: Secondary | ICD-10-CM | POA: Diagnosis not present

## 2024-07-19 DIAGNOSIS — R002 Palpitations: Secondary | ICD-10-CM | POA: Diagnosis not present

## 2024-08-11 DIAGNOSIS — R002 Palpitations: Secondary | ICD-10-CM | POA: Diagnosis not present

## 2024-08-21 DIAGNOSIS — M25562 Pain in left knee: Secondary | ICD-10-CM | POA: Diagnosis not present

## 2024-08-21 DIAGNOSIS — M6281 Muscle weakness (generalized): Secondary | ICD-10-CM | POA: Diagnosis not present

## 2024-08-21 DIAGNOSIS — M25662 Stiffness of left knee, not elsewhere classified: Secondary | ICD-10-CM | POA: Diagnosis not present

## 2024-08-27 DIAGNOSIS — M6281 Muscle weakness (generalized): Secondary | ICD-10-CM | POA: Diagnosis not present

## 2024-08-27 DIAGNOSIS — M25662 Stiffness of left knee, not elsewhere classified: Secondary | ICD-10-CM | POA: Diagnosis not present

## 2024-08-27 DIAGNOSIS — M25562 Pain in left knee: Secondary | ICD-10-CM | POA: Diagnosis not present

## 2024-09-03 DIAGNOSIS — M6281 Muscle weakness (generalized): Secondary | ICD-10-CM | POA: Diagnosis not present

## 2024-09-03 DIAGNOSIS — M25662 Stiffness of left knee, not elsewhere classified: Secondary | ICD-10-CM | POA: Diagnosis not present

## 2024-09-03 DIAGNOSIS — M25562 Pain in left knee: Secondary | ICD-10-CM | POA: Diagnosis not present

## 2024-09-10 DIAGNOSIS — Z6828 Body mass index (BMI) 28.0-28.9, adult: Secondary | ICD-10-CM | POA: Diagnosis not present

## 2024-09-10 DIAGNOSIS — I471 Supraventricular tachycardia, unspecified: Secondary | ICD-10-CM | POA: Diagnosis not present

## 2024-09-10 DIAGNOSIS — I10 Essential (primary) hypertension: Secondary | ICD-10-CM | POA: Diagnosis not present

## 2024-10-30 NOTE — Progress Notes (Signed)
 Julie Sexton                                          MRN: [id number]   10/30/2024   The [facility] Quality Team Specialist reviewed this patient medical record for the purposes of chart review for care gap closure. The following were reviewed: chart review for care gap closure-controlling blood pressure.    [facility]I Quality Team
